# Patient Record
Sex: Male | Born: 1960 | Race: White | Hispanic: No | Marital: Married | Smoking: Former smoker
Health system: Southern US, Community
[De-identification: ages and names within clinical notes are randomized; demographics above are authoritative.]

## PROBLEM LIST (undated history)

## (undated) DIAGNOSIS — G8929 Other chronic pain: Secondary | ICD-10-CM

## (undated) DIAGNOSIS — M549 Dorsalgia, unspecified: Secondary | ICD-10-CM

## (undated) DIAGNOSIS — R05 Cough: Secondary | ICD-10-CM

## (undated) DIAGNOSIS — F419 Anxiety disorder, unspecified: Secondary | ICD-10-CM

## (undated) DIAGNOSIS — I10 Essential (primary) hypertension: Secondary | ICD-10-CM

## (undated) DIAGNOSIS — K759 Inflammatory liver disease, unspecified: Secondary | ICD-10-CM

## (undated) DIAGNOSIS — R059 Cough, unspecified: Secondary | ICD-10-CM

## (undated) HISTORY — PX: BACK SURGERY: SHX140

## (undated) HISTORY — PX: HERNIA REPAIR: SHX51

---

## 2006-08-23 HISTORY — PX: HAND FUSION: SHX975

## 2006-11-02 ENCOUNTER — Observation Stay (HOSPITAL_COMMUNITY): Admission: EM | Admit: 2006-11-02 | Discharge: 2006-11-03 | Payer: Self-pay | Admitting: Emergency Medicine

## 2009-05-23 ENCOUNTER — Ambulatory Visit (HOSPITAL_COMMUNITY): Admission: RE | Admit: 2009-05-23 | Discharge: 2009-05-24 | Payer: Self-pay | Admitting: Orthopaedic Surgery

## 2010-11-27 LAB — BASIC METABOLIC PANEL
BUN: 11 mg/dL (ref 6–23)
Chloride: 105 mEq/L (ref 96–112)
Creatinine, Ser: 0.8 mg/dL (ref 0.4–1.5)
GFR calc Af Amer: >60 mL/min (ref >60)
GFR calc non Af Amer: >60 mL/min (ref >60)

## 2010-11-27 LAB — CBC
MCV: 98.7 fL (ref 78.0–100.0)
Platelets: 167 10*3/uL (ref 150–400)
RBC: 4.41 MIL/uL (ref 4.22–5.81)
WBC: 7.5 10*3/uL (ref 4.0–10.5)

## 2010-11-27 LAB — DIFFERENTIAL
Eosinophils Absolute: 0.3 10*3/uL (ref 0.0–0.7)
Lymphocytes Relative: 23 % (ref 12–46)
Lymphs Abs: 1.8 10*3/uL (ref 0.7–4.0)
Monocytes Relative: 10 % (ref 3–12)
Neutro Abs: 4.5 10*3/uL (ref 1.7–7.7)
Neutrophils Relative %: 60 % (ref 43–77)

## 2011-01-08 NOTE — Op Note (Signed)
NAME:  Philip Freeman, Philip Freeman                ACCOUNT NO.:  [id number]   MEDICAL RECORD NO.:  [id number]          PATIENT TYPE:  OBV   LOCATION:  3011                         FACILITY:  [facility]   PHYSICIAN:  Mark C. Ophelia Charter, M.D.    DATE OF BIRTH:  October 07, 1960   DATE OF PROCEDURE:  11/02/2006  DATE OF DISCHARGE:                               OPERATIVE REPORT   PREOPERATIVE DIAGNOSIS:  Right hand severe crush injury with multiple  open fractures and nerve injuries.   POSTOPERATIVE DIAGNOSIS:  Right hand severe crush injury with multiple  open fractures and nerve injuries.   PROCEDURES:  1. Irrigation and excisional debridement of skin, subcutaneous tissue      and bone, first and second opened metacarpal shaft fractures.  2. Open reduction and internal fixation, first and second metacarpal      shaft fractures.  3. Irrigation and debridement of skin, subcutaneous tissue and bone,      open thumb intra-articular distal phalanx fracture.  4. Open reduction and internal fixation, thumb distal phalanx      fracture.  5. Carpal tunnel release and release of thenar compartment.  6. Microscopic repair at the carpal tunnel level of complete median      nerve laceration.  7. Microscopic repair of the first common digital nerve (to thumb and      index), in [place] man[place].  8. Closure of multiple and lacerations, palmar and dorsal, greater      than 30 cm.  9. Irrigation and debridement, skin and subcutaneous tissue, right      fourth metacarpal shaft fracture.  10.Repair of extensor pollicis longus tendon.   SURGEON:  Annell Greening, MD   ANESTHESIA:  GOT.   TOURNIQUET TIME:  Up x6 minutes during prepping, down x42 minutes, up x1  hour 5 minutes, for a total tourniquet time of 1 hour 11 minutes.   ESTIMATED BLOOD LOSS:  200 mL.   This 50 year old male was at work on the job and got his hand crushed in  a trimming machine.  The machine normally trims off the excess edges  from plastic containers.   There were multiple lacerations in the hand  and complete numbness of the hand, multiple open lacerations dorsal,  volar, with thumb deformity.  X-rays demonstrated a fracture of the  midshaft of the thumb metacarpal which is open, midshaft fracture of the  index metacarpal which was open, open fourth metacarpal nondisplaced  shaft fracture.  Open IP joint of the thumb with disruption of extensor  tendon.  The patient out open extensor tendon laceration at the level of  the IP joint of the thumb.  The tourniquet had to be inflated during  prepping due to blood loss at 250.  Once Ancef been given, time-out had  been taken, the Ancef was redosed.  One dose had been given in the  emergency room and he had received his tetanus.  Usual stockinette,  extremity sheets and drapes were applied.  With the tourniquet up, the  wound was copiously irrigated, the wound was reinspected, and all the  above  injuries were noticed.  The thenar muscle had a crush injury, had  opened, and the thenar fascia was split.  There was a laceration through  the thenar muscle extending down; however, the common digital artery to  the thumb and index was intact.  There was some venous bleeding over the  dorsum over the first dorsal compartment.  X-rays revealed a transverse  laceration of the first communal digital artery.  The operative  microscope was brought into the room and draped.  Open fractures of the  thumb metacarpal and index metacarpal were sharply debrided, removing  some small fragments of bone.  Fragments were reduced and then crossed K-  wires were used for stabilization and checked under fluoroscopy and K-  wires adjusted.  Next the open injury to the IP joint of the thumb was  inspected.  Bone was debrided of some small pieces.  This was split  between the condyles.  This was reduced and using two different K-wires  for reduction, an 0.045 was placed across the two condyles.  Vicryl 4-0  was used for  repair of the extensor tendon.  There was a laceration that  extended from the thenar muscle down to the carpal canal, and this was  extended proximally when visualization revealed a transverse laceration  of the median nerve at the distal wrist crease level.  The incision was  extended 4-5 cm.  Distal forearm fascia was split following the ulnar  border of the median nerve.  The motor branch of the thenar muscle was  identified distally and the carpal canal was in a distal and radial  position.  The palmar fascia was split and the common digital nerves  were followed up.  Branches to the long and ring finger were intact as  well as the common to the index, long finger was intact.  Using the  operative microscope draped, the common digital nerve of the thumb and  index finger was repaired with total of five 8-0 sutures using the  operative microscope and microscopic hand instruments.  Next, the median  nerve was repaired with a total of 10 sutures in the epineurium, making  sure to line up the thenar motor branch portion of the nerve in  appropriate position.  Since this laceration was transverse, the nerve  lay down in good position and appropriate orientation was easy to  obtain.  Flexor tendons underneath the median nerve were intact.  Vascular arch in the palm was intact.  Incision had be extended distally  and there was a laceration that extended following the distal wrist  crease from the small finger all the way across to the base of the  thumb.  Flexor tendons were carefully inspected, lumbricals, common  digital nerves, and all were intact.  Copious irrigation was used.  Excision of devitalized skin flaps in pieces from the crush injury.  Skin was reapproximated with 4-0 nylon, closing approximately 30-35 cm  of lacerations on the dorsum of the hand as well as over the palmar  surface of the hand.  There was an open laceration of the mid to distal aspect over the fourth  metacarpal.  Extensor tendon was inspected and  was intact.  The open fracture was irrigated, debridement of some skin  and subcutaneous tissue but did not require bone debridement to the  fourth metacarpal.  The third metacarpal was intact and only had skin  lacerations involving this area.  Skin was released from the K-wires to  make sure there was no tension.  K-wires were bent, cut, pin protectors  applied.  Skin was then closed, taking approximately an hour and a half  to close all lacerations anatomically, lining up the skin folds.  The  pin to the thumb was bent and cut as well and the laceration over the  dorsum of the thumb extended along the radial aspect of the nail plate  and eponychium junction.  This was repaired with sutures back through  the skin and then penetrating through the nail bed and nail plate of 4-0  nylon.  There was good vascularity to the thumb.  Spot fluoro pictures  were taken documenting the reduction and fixation of the bones.  The  patient was placed in a short-arm splint with tweeners, 4x4s, Webril,  the wrist extended, MCPs flexed, PIPs extended.  The patient tolerated  the procedure well, transferred to the recovery room in stable  condition.      Mark C. Ophelia Charter, M.D.  Electronically Signed     MCY/MEDQ  D:  11/02/2006  T:  11/04/2006  Job:  [id number]   cc:   [employer]

## 2011-08-27 ENCOUNTER — Observation Stay (HOSPITAL_COMMUNITY): Payer: Medicare PPO

## 2011-08-27 ENCOUNTER — Observation Stay (HOSPITAL_COMMUNITY)
Admission: EM | Admit: 2011-08-27 | Discharge: 2011-08-27 | Disposition: A | Discharge status: Home / Self Care | Payer: Medicare PPO | Attending: Emergency Medicine | Admitting: Emergency Medicine

## 2011-08-27 DIAGNOSIS — M5126 Other intervertebral disc displacement, lumbar region: Secondary | ICD-10-CM | POA: Insufficient documentation

## 2011-08-27 DIAGNOSIS — R5383 Other fatigue: Secondary | ICD-10-CM | POA: Insufficient documentation

## 2011-08-27 DIAGNOSIS — G8929 Other chronic pain: Principal | ICD-10-CM | POA: Insufficient documentation

## 2011-08-27 DIAGNOSIS — R209 Unspecified disturbances of skin sensation: Secondary | ICD-10-CM | POA: Insufficient documentation

## 2011-08-27 DIAGNOSIS — F17200 Nicotine dependence, unspecified, uncomplicated: Secondary | ICD-10-CM | POA: Insufficient documentation

## 2011-08-27 DIAGNOSIS — R5381 Other malaise: Secondary | ICD-10-CM | POA: Insufficient documentation

## 2011-08-27 DIAGNOSIS — M549 Dorsalgia, unspecified: Secondary | ICD-10-CM

## 2011-08-27 HISTORY — DX: Other chronic pain: G89.29

## 2011-08-27 HISTORY — DX: Dorsalgia, unspecified: M54.9

## 2011-08-27 MED ORDER — DIAZEPAM 5 MG/ML IJ SOLN: 5.0000 mg | Freq: Four times a day (QID) | INTRAMUSCULAR | Status: DC | PRN

## 2011-08-27 MED ORDER — DIAZEPAM 5 MG PO TABS: 5.0000 mg | ORAL_TABLET | Freq: Four times a day (QID) | ORAL | Status: DC | PRN

## 2011-08-27 MED ORDER — PROMETHAZINE HCL 25 MG PO TABS: 25.0000 mg | ORAL_TABLET | Freq: Four times a day (QID) | ORAL | Status: AC | PRN

## 2011-08-27 MED ORDER — HYDROMORPHONE HCL PF 1 MG/ML IJ SOLN
1.0000 mg | Freq: Once | INTRAMUSCULAR | Status: AC
  Administered 2011-08-27: 1 mg via INTRAVENOUS
  Filled 2011-08-27: qty 1

## 2011-08-27 MED ORDER — OXYCODONE-ACETAMINOPHEN 5-325 MG PO TABS: 1.0000 | ORAL_TABLET | Freq: Four times a day (QID) | ORAL | Status: DC | PRN

## 2011-08-27 MED ORDER — HYDROMORPHONE HCL PF 1 MG/ML IJ SOLN: 1.0000 mg | Freq: Four times a day (QID) | INTRAMUSCULAR | Status: DC | PRN

## 2011-08-27 MED ORDER — METHYLPREDNISOLONE SODIUM SUCC 125 MG IJ SOLR
125.0000 mg | Freq: Two times a day (BID) | INTRAMUSCULAR | Status: DC
  Administered 2011-08-27: 125 mg via INTRAVENOUS
  Filled 2011-08-27: qty 2

## 2011-08-27 MED ORDER — KETOROLAC TROMETHAMINE 30 MG/ML IJ SOLN: 30.0000 mg | Freq: Four times a day (QID) | INTRAMUSCULAR | Status: DC | PRN

## 2011-08-27 MED ORDER — KETOROLAC TROMETHAMINE 30 MG/ML IJ SOLN
30.0000 mg | Freq: Once | INTRAMUSCULAR | Status: AC
  Administered 2011-08-27: 30 mg via INTRAVENOUS
  Filled 2011-08-27: qty 1

## 2011-08-27 MED ORDER — CYCLOBENZAPRINE HCL 10 MG PO TABS: 5.0000 mg | ORAL_TABLET | Freq: Three times a day (TID) | ORAL | Status: DC | PRN

## 2011-08-27 MED ORDER — OXYCODONE-ACETAMINOPHEN 10-325 MG PO TABS: 1.0000 | ORAL_TABLET | ORAL | Status: DC | PRN

## 2011-08-27 MED ORDER — SODIUM CHLORIDE 0.9 % IV BOLUS (SEPSIS)
1000.0000 mL | Freq: Once | INTRAVENOUS | Status: AC
  Administered 2011-08-27: 1000 mL via INTRAVENOUS

## 2011-08-27 MED ORDER — MORPHINE SULFATE 4 MG/ML IJ SOLN: 4.0000 mg | INTRAMUSCULAR | Status: DC | PRN

## 2011-08-27 MED ORDER — ONDANSETRON HCL 4 MG/2ML IJ SOLN
4.0000 mg | Freq: Four times a day (QID) | INTRAMUSCULAR | Status: DC | PRN
  Administered 2011-08-27: 4 mg via INTRAVENOUS
  Filled 2011-08-27: qty 2

## 2011-08-27 MED ORDER — ACETAMINOPHEN 325 MG PO TABS: 650.0000 mg | ORAL_TABLET | ORAL | Status: DC | PRN

## 2011-08-27 NOTE — ED Notes (Signed)
Pt given pillow and warm blanket.

## 2011-08-27 NOTE — ED Notes (Signed)
Received from mri. Awake alert and oriented x 3. Vs stable.

## 2011-08-27 NOTE — ED Notes (Signed)
Pt presents with 6 month h/o low back pain.  Last MRI was 11/28, showed protruding disc.  Epidural done 12/18, pain has worsened x 4-5 days.  Pain radiates down L leg, pt denies any urinary or fecal incontinence.

## 2011-08-27 NOTE — ED Notes (Signed)
Patient transported to MRI stable and in no acute distress.

## 2011-08-27 NOTE — ED Notes (Signed)
Pt states he has had back pain x 6 months and had appointment today at 3 pm to see dr Ophelia Charter but pain has been so bad he  Has not been able to get out odf the bed x 2 days

## 2011-08-27 NOTE — ED Provider Notes (Signed)
5:44 PM spoke with dr. Ophelia Charter who read the MRI and stated that there were no findings on MRI that were alarming. No loss in bowel or bladder function. Advised giving him pain medication to go home and following up with him in the office.  Patient does not present with bilateral leg pain and weakness, urinary retention with overflow incontinence, fecal incontinece, saddle anesthesia. No acute onset of back, flank or groint pain. No history of cancer, unexplained weight loss. No fevers. No injuries. No concern for caudal equina syndrome per Dr. Ophelia Charter.  Advised patient of warning signs to return.     Demetrius Charity, PA 08/27/11 1746

## 2011-08-27 NOTE — ED Provider Notes (Signed)
Medical screening examination/treatment/procedure(s) were performed by non-physician practitioner and as supervising physician I was immediately available for consultation/collaboration.  Zunaira Lamy P Lindel Marcell, MD 08/27/11 1952 

## 2011-08-27 NOTE — ED Provider Notes (Signed)
History     CSN: [id number]  Arrival date & time 08/27/11  1149   First MD Initiated Contact with Patient 08/27/11 1429     3:36 PM HPI Patient reports chronic back pain for several months. Reports pain begins in her lower left back and radiates down the entire left lower extremity. Reports he has been treated by Dr. Ophelia Charter. States Dr. Ophelia Charter wanted to do surgery but his insurance had not approved it yet. Reports pain has worsened. States unbearable now and able to ambulate. Reports having numbness on medial distal thigh and down entire left lateral thigh. Denies urinary or fecal incontinence. Reports weakness. Patient states he has "spinal compression". Wife is very demanding. Is demanding he have surgery today. Discussed with patient and family that I would speak with Dr. Ophelia Charter personally.  Patient is a 51 y.o. male presenting with back pain. The history is provided by the patient and the spouse.  Back Pain  This is a chronic problem. Episode onset: several months. The problem occurs constantly. The problem has been gradually worsening. The pain is associated with no known injury. The pain is present in the lumbar spine. The quality of the pain is described as stabbing and shooting. The pain radiates to the left thigh, left knee and left foot. The pain is at a severity of 10/10. The pain is severe. The symptoms are aggravated by bending, twisting and certain positions. Associated symptoms include numbness, leg pain, paresthesias, tingling and weakness. Pertinent negatives include no chest pain, no fever, no headaches, no abdominal pain, no bowel incontinence, no perianal numbness, no bladder incontinence, no dysuria, no pelvic pain and no paresis.    Past Medical History  Diagnosis Date  . Chronic back pain     Past Surgical History  Procedure Date  . Hand fusion     No family history on file.  History  Substance Use Topics  . Smoking status: Current Everyday Smoker -- 1.0 packs/day  .  Smokeless tobacco: Not on file  . Alcohol Use: Yes      Review of Systems  Constitutional: Negative for fever and chills.  HENT: Negative for neck pain.   Respiratory: Negative for cough and shortness of breath.   Cardiovascular: Negative for chest pain and palpitations.  Gastrointestinal: Negative for nausea, vomiting, abdominal pain and bowel incontinence.  Genitourinary: Negative for bladder incontinence, dysuria, hematuria, flank pain and pelvic pain.  Musculoskeletal: Positive for back pain. Negative for myalgias and gait problem.       Denies saddle anesthesias, perineal numbness, bowel incontinence, urinary incontinence  Neurological: Positive for tingling, weakness, numbness and paresthesias. Negative for dizziness and headaches.  All other systems reviewed and are negative.    Allergies  Review of patient's allergies indicates no known allergies.  Home Medications   Current Outpatient Rx  Name Route Sig Dispense Refill  . AMLODIPINE BESYLATE 5 MG PO TABS Oral Take 5 mg by mouth daily.      Marland Kitchen CLONAZEPAM 0.5 MG PO TABS Oral Take 0.5 mg by mouth at bedtime as needed.      Marland Kitchen HYDROCODONE-ACETAMINOPHEN 5-325 MG PO TABS Oral Take 1 tablet by mouth every 6 (six) hours as needed. For pain     . MENTHOL (TOPICAL ANALGESIC) 5 % EX PADS Apply externally Apply 1 patch topically daily as needed. For back pain     . METOPROLOL TARTRATE 25 MG PO TABS Oral Take 25 mg by mouth 2 (two) times daily.  BP 136/86  Pulse 84  Temp(Src) 97.4 F (36.3 C) (Oral)  Resp 16  Ht 5\' 10"  (1.778 m)  Wt 160 lb (72.576 kg)  BMI 22.96 kg/m2  SpO2 100%  Physical Exam  Constitutional: He is oriented to person, place, and time. He appears well-developed and well-nourished.  HENT:  Head: Normocephalic and atraumatic.  Eyes: Conjunctivae are normal. Pupils are equal, round, and reactive to light.  Neck: Normal range of motion. Neck supple.  Cardiovascular: Normal rate, regular rhythm and  normal heart sounds.   Pulmonary/Chest: Effort normal and breath sounds normal.  Abdominal: Soft. Bowel sounds are normal.  Musculoskeletal:       Left knee: Normal. He exhibits normal range of motion, no swelling and no effusion. no tenderness found.       Left ankle: Normal. He exhibits normal range of motion, no swelling and normal pulse. no tenderness.       Lumbar back: He exhibits decreased range of motion, tenderness and bony tenderness. He exhibits no swelling, no edema, no pain, no spasm and normal pulse.       Left lower leg: He exhibits no tenderness, no bony tenderness, no swelling, no edema, no deformity and no laceration.  Neurological: He is alert and oriented to person, place, and time.  Skin: Skin is warm and dry. No rash noted. No erythema. No pallor.  Psychiatric: He has a normal mood and affect. His behavior is normal.    ED Course  Procedures    MDM  3:47 PM Spoke with Dr. Ophelia Charter. Reports MRI done late last year showed a disc herniation without spinal compression. Recommended another MRI be done today since patient complains of worsening symptoms. States that patient can have Toradol for his pain and likely discharge if no worsening symptoms on MRI. States if change on MRI may discuss with Dr. Magnus Ivan who is on call for orthopedic surgery. Discussed this with patient and family. Advised in addition we will place patient on the back pain protocol until pain is better controlled.        Thomasene Lot, [place] 08/27/11 1549

## 2011-08-30 ENCOUNTER — Other Ambulatory Visit (HOSPITAL_COMMUNITY): Payer: Self-pay | Admitting: Orthopaedic Surgery

## 2011-08-30 ENCOUNTER — Encounter (HOSPITAL_COMMUNITY): Payer: Self-pay

## 2011-08-30 DIAGNOSIS — M5126 Other intervertebral disc displacement, lumbar region: Secondary | ICD-10-CM | POA: Diagnosis present

## 2011-08-30 NOTE — H&P (Signed)
Philip Freeman is an 51 y.o. male.   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  He has been treated successfully for the last year with ESI's.  Last ESI was in November 2012 and lasted only a short time.  Now his pain has returned and is unrelieved with activity modification and analgesics. MRI of the lumbar spine done in October 2012 showed left paracentral HNP at L4-5 with mass effect on the thecal sac, lateral recess and L5 nerve root.  After discussion of risks and benefits as well as surgical vs nonsurgical treatment, pt wishes to proceed with microdiscectomy at left L4-5 by Dr. Ophelia Charter.  Past Medical History  Diagnosis Date  . Chronic back pain     Past Surgical History  Procedure Date  . Hand fusion     No family history on file. Social History:  reports that he has been smoking.  He does not have any smokeless tobacco history on file. He reports that he drinks alcohol. He reports that he does not use illicit drugs.  Allergies: No Known Allergies  No current facility-administered medications on file as of .   Medications Prior to Admission  Medication Sig Dispense Refill  . amLODipine (NORVASC) 5 MG tablet Take 5 mg by mouth daily.        . clonazePAM (KLONOPIN) 0.5 MG tablet Take 0.5-1 mg by mouth 2 (two) times daily. 1 tab in the morning and 2 tablets at night      . Menthol, Topical Analgesic, (ICY HOT) 5 % PADS Apply 1 patch topically daily as needed. For back pain       . oxyCODONE-acetaminophen (PERCOCET) 10-325 MG per tablet Take 1 tablet by mouth every 4 (four) hours as needed for pain.  20 tablet  0  . promethazine (PHENERGAN) 25 MG tablet Take 1 tablet (25 mg total) by mouth every 6 (six) hours as needed for nausea.  12 tablet  0    No results found for this or any previous visit (from the past 48 hour(s)). No results found.  Review of Systems    Constitutional: Negative.   HENT: Negative.   Eyes: Negative.   Respiratory: Negative.   Cardiovascular: Negative.   Gastrointestinal: Negative.   Genitourinary: Negative.   Musculoskeletal: Positive for back pain.  Skin: Negative.   Neurological:       Left leg pain and occasional numbness  Endo/Heme/Allergies: Negative.   Psychiatric/Behavioral: Negative.     There were no vitals taken for this visit. Physical Exam  Constitutional: He is oriented to person, place, and time. He appears well-developed and well-nourished.  HENT:  Head: Normocephalic and atraumatic.  Eyes: EOM are normal. Pupils are equal, round, and reactive to light.  Neck: Normal range of motion.  Cardiovascular: Normal rate.   Respiratory: Effort normal.  GI: Soft.  Musculoskeletal:       Pt has weakness left EHL and ant. tib  At 4/5.  Positive SLR left.  Distal pulse intact   Neurological: He is alert and oriented to person, place, and time.  Skin: Skin is warm and dry.     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  Toua Stites M 08/30/2011, 3:27 PM

## 2011-08-31 ENCOUNTER — Encounter (HOSPITAL_COMMUNITY): Payer: Self-pay | Admitting: *Deleted

## 2011-08-31 MED ORDER — CEFAZOLIN SODIUM 1-5 GM-% IV SOLN
1.0000 g | INTRAVENOUS | Status: AC
  Administered 2011-09-01: 2 g via INTRAVENOUS
  Filled 2011-08-31: qty 50

## 2011-08-31 MED ORDER — CHLORHEXIDINE GLUCONATE 4 % EX LIQD: 60.0000 mL | Freq: Once | CUTANEOUS | Status: DC

## 2011-08-31 NOTE — ED Provider Notes (Signed)
Medical screening examination/treatment/procedure(s) were performed by non-physician practitioner and as supervising physician I was immediately available for consultation/collaboration.   Deforest Maiden, MD 08/31/11 1255 

## 2011-09-01 ENCOUNTER — Encounter (HOSPITAL_COMMUNITY): Payer: Self-pay | Admitting: Anesthesiology

## 2011-09-01 ENCOUNTER — Ambulatory Visit (HOSPITAL_COMMUNITY): Payer: Medicare PPO

## 2011-09-01 ENCOUNTER — Encounter (HOSPITAL_COMMUNITY)
Admission: RE | Disposition: A | Discharge status: Home / Self Care | Payer: Self-pay | Source: Ambulatory Visit | Attending: Orthopaedic Surgery

## 2011-09-01 ENCOUNTER — Ambulatory Visit (HOSPITAL_COMMUNITY)
Admission: RE | Admit: 2011-09-01 | Discharge: 2011-09-02 | Disposition: A | Discharge status: Home / Self Care | Payer: Medicare PPO | Source: Ambulatory Visit | Attending: Orthopaedic Surgery | Admitting: Orthopaedic Surgery

## 2011-09-01 ENCOUNTER — Other Ambulatory Visit: Payer: Self-pay

## 2011-09-01 ENCOUNTER — Ambulatory Visit (HOSPITAL_COMMUNITY): Payer: Medicare PPO | Admitting: Anesthesiology

## 2011-09-01 DIAGNOSIS — M5126 Other intervertebral disc displacement, lumbar region: Secondary | ICD-10-CM | POA: Diagnosis present

## 2011-09-01 DIAGNOSIS — F1010 Alcohol abuse, uncomplicated: Secondary | ICD-10-CM | POA: Insufficient documentation

## 2011-09-01 DIAGNOSIS — I10 Essential (primary) hypertension: Secondary | ICD-10-CM | POA: Insufficient documentation

## 2011-09-01 DIAGNOSIS — F17200 Nicotine dependence, unspecified, uncomplicated: Secondary | ICD-10-CM | POA: Insufficient documentation

## 2011-09-01 DIAGNOSIS — F411 Generalized anxiety disorder: Secondary | ICD-10-CM | POA: Insufficient documentation

## 2011-09-01 DIAGNOSIS — B1920 Unspecified viral hepatitis C without hepatic coma: Secondary | ICD-10-CM | POA: Insufficient documentation

## 2011-09-01 HISTORY — DX: Cough, unspecified: R05.9

## 2011-09-01 HISTORY — DX: Essential (primary) hypertension: I10

## 2011-09-01 HISTORY — DX: Cough: R05

## 2011-09-01 HISTORY — PX: LUMBAR LAMINECTOMY: SHX95

## 2011-09-01 HISTORY — DX: Inflammatory liver disease, unspecified: K75.9

## 2011-09-01 HISTORY — DX: Anxiety disorder, unspecified: F41.9

## 2011-09-01 LAB — CBC
HCT: 39.1 % (ref 39.0–52.0)
Hemoglobin: 14.2 g/dL (ref 13.0–17.0)
MCH: 35.3 pg — ABNORMAL HIGH (ref 26.0–34.0)
MCHC: 36.3 g/dL — ABNORMAL HIGH (ref 30.0–36.0)
MCV: 97.3 fL (ref 78.0–100.0)
Platelets: 140 10*3/uL — ABNORMAL LOW (ref 150–400)
RBC: 4.02 MIL/uL — ABNORMAL LOW (ref 4.22–5.81)
RDW: 11.7 % (ref 11.5–15.5)
WBC: 16.3 10*3/uL — ABNORMAL HIGH (ref 4.0–10.5)

## 2011-09-01 LAB — SURGICAL PCR SCREEN
MRSA, PCR: NEGATIVE
Staphylococcus aureus: NEGATIVE

## 2011-09-01 LAB — COMPREHENSIVE METABOLIC PANEL
AST: 42 U/L — ABNORMAL HIGH (ref 0–37)
Albumin: 3.7 g/dL (ref 3.5–5.2)
Alkaline Phosphatase: 55 U/L (ref 39–117)
CO2: 25 mEq/L (ref 19–32)
Chloride: 101 mEq/L (ref 96–112)
GFR calc non Af Amer: >90 mL/min (ref >90)
Potassium: 3.6 mEq/L (ref 3.5–5.1)
Total Bilirubin: 0.6 mg/dL (ref 0.3–1.2)

## 2011-09-01 LAB — PROTIME-INR: Prothrombin Time: 13.2 seconds (ref 11.6–15.2)

## 2011-09-01 LAB — URINALYSIS, ROUTINE W REFLEX MICROSCOPIC
Bilirubin Urine: NEGATIVE
Glucose, UA: NEGATIVE mg/dL
Hgb urine dipstick: NEGATIVE
Protein, ur: NEGATIVE mg/dL
Urobilinogen, UA: 0.2 mg/dL (ref 0.0–1.0)

## 2011-09-01 SURGERY — MICRODISCECTOMY LUMBAR LAMINECTOMY
Anesthesia: General | Site: Spine Lumbar | Laterality: Left | Wound class: Clean

## 2011-09-01 MED ORDER — CEFAZOLIN SODIUM 1-5 GM-% IV SOLN
1.0000 g | Freq: Three times a day (TID) | INTRAVENOUS | Status: AC
  Administered 2011-09-01 – 2011-09-02 (×2): 1 g via INTRAVENOUS
  Filled 2011-09-01 (×2): qty 50

## 2011-09-01 MED ORDER — GLYCOPYRROLATE 0.2 MG/ML IJ SOLN
INTRAMUSCULAR | Status: DC | PRN
  Administered 2011-09-01: .6 mg via INTRAVENOUS

## 2011-09-01 MED ORDER — MEPERIDINE HCL 25 MG/ML IJ SOLN: 6.2500 mg | INTRAMUSCULAR | Status: DC | PRN

## 2011-09-01 MED ORDER — ONDANSETRON HCL 4 MG/2ML IJ SOLN
INTRAMUSCULAR | Status: DC | PRN
  Administered 2011-09-01: 4 mg via INTRAVENOUS

## 2011-09-01 MED ORDER — METOPROLOL SUCCINATE ER 25 MG PO TB24
25.0000 mg | ORAL_TABLET | Freq: Every day | ORAL | Status: DC
  Filled 2011-09-01: qty 1

## 2011-09-01 MED ORDER — KCL IN DEXTROSE-NACL 20-5-0.45 MEQ/L-%-% IV SOLN
INTRAVENOUS | Status: DC
  Administered 2011-09-01 – 2011-09-02 (×2): via INTRAVENOUS
  Filled 2011-09-01 (×3): qty 1000

## 2011-09-01 MED ORDER — HYDROCODONE-ACETAMINOPHEN 5-325 MG PO TABS: 1.0000 | ORAL_TABLET | ORAL | Status: DC | PRN

## 2011-09-01 MED ORDER — SODIUM CHLORIDE 0.9 % IJ SOLN: 3.0000 mL | INTRAMUSCULAR | Status: DC | PRN

## 2011-09-01 MED ORDER — PROMETHAZINE HCL 25 MG/ML IJ SOLN: 6.2500 mg | INTRAMUSCULAR | Status: DC | PRN

## 2011-09-01 MED ORDER — ACETAMINOPHEN 650 MG RE SUPP: 650.0000 mg | RECTAL | Status: DC | PRN

## 2011-09-01 MED ORDER — ALBUTEROL SULFATE (5 MG/ML) 0.5% IN NEBU
2.5000 mg | INHALATION_SOLUTION | RESPIRATORY_TRACT | Status: DC
  Administered 2011-09-01 – 2011-09-02 (×5): 2.5 mg via RESPIRATORY_TRACT
  Filled 2011-09-01 (×5): qty 0.5

## 2011-09-01 MED ORDER — AMLODIPINE BESYLATE 5 MG PO TABS
5.0000 mg | ORAL_TABLET | Freq: Every day | ORAL | Status: DC
  Filled 2011-09-01: qty 1

## 2011-09-01 MED ORDER — LIDOCAINE HCL (CARDIAC) 20 MG/ML IV SOLN
INTRAVENOUS | Status: DC | PRN
  Administered 2011-09-01: 40 mg via INTRAVENOUS

## 2011-09-01 MED ORDER — CLONAZEPAM 0.5 MG PO TABS
0.5000 mg | ORAL_TABLET | Freq: Two times a day (BID) | ORAL | Status: DC
  Administered 2011-09-01 – 2011-09-02 (×2): 0.5 mg via ORAL
  Filled 2011-09-01 (×2): qty 1

## 2011-09-01 MED ORDER — KETOROLAC TROMETHAMINE 30 MG/ML IJ SOLN
30.0000 mg | Freq: Four times a day (QID) | INTRAMUSCULAR | Status: DC
  Administered 2011-09-01 – 2011-09-02 (×3): 30 mg via INTRAVENOUS
  Filled 2011-09-01 (×7): qty 1

## 2011-09-01 MED ORDER — MENTHOL 3 MG MT LOZG: 1.0000 | LOZENGE | OROMUCOSAL | Status: DC | PRN

## 2011-09-01 MED ORDER — ACETAMINOPHEN 325 MG PO TABS: 650.0000 mg | ORAL_TABLET | ORAL | Status: DC | PRN

## 2011-09-01 MED ORDER — HYDROMORPHONE HCL PF 1 MG/ML IJ SOLN
INTRAMUSCULAR | Status: DC | PRN
Start: 2011-09-01 — End: 2011-09-01
  Administered 2011-09-01 (×2): 0.5 mg via INTRAVENOUS

## 2011-09-01 MED ORDER — BISACODYL 10 MG RE SUPP: 10.0000 mg | Freq: Every day | RECTAL | Status: DC | PRN

## 2011-09-01 MED ORDER — ZOLPIDEM TARTRATE 10 MG PO TABS: 10.0000 mg | ORAL_TABLET | Freq: Every evening | ORAL | Status: DC | PRN

## 2011-09-01 MED ORDER — LACTATED RINGERS IV SOLN
INTRAVENOUS | Status: DC
  Administered 2011-09-01: 13:00:00 via INTRAVENOUS

## 2011-09-01 MED ORDER — 0.9 % SODIUM CHLORIDE (POUR BTL) OPTIME
TOPICAL | Status: DC | PRN
  Administered 2011-09-01: 1000 mL

## 2011-09-01 MED ORDER — HYDROMORPHONE HCL PF 1 MG/ML IJ SOLN: 0.2500 mg | INTRAMUSCULAR | Status: DC | PRN

## 2011-09-01 MED ORDER — PHENOL 1.4 % MT LIQD
1.0000 | OROMUCOSAL | Status: DC | PRN
  Filled 2011-09-01: qty 177

## 2011-09-01 MED ORDER — METHOCARBAMOL 500 MG PO TABS
500.0000 mg | ORAL_TABLET | Freq: Four times a day (QID) | ORAL | Status: DC | PRN
  Administered 2011-09-01 – 2011-09-02 (×2): 500 mg via ORAL
  Filled 2011-09-01 (×2): qty 1

## 2011-09-01 MED ORDER — DOCUSATE SODIUM 100 MG PO CAPS
100.0000 mg | ORAL_CAPSULE | Freq: Two times a day (BID) | ORAL | Status: DC
  Administered 2011-09-01 – 2011-09-02 (×2): 100 mg via ORAL
  Filled 2011-09-01 (×4): qty 1

## 2011-09-01 MED ORDER — MIDAZOLAM HCL 5 MG/5ML IJ SOLN
INTRAMUSCULAR | Status: DC | PRN
  Administered 2011-09-01: 2 mg via INTRAVENOUS

## 2011-09-01 MED ORDER — PROPOFOL 10 MG/ML IV EMUL
INTRAVENOUS | Status: DC | PRN
  Administered 2011-09-01: 200 mg via INTRAVENOUS

## 2011-09-01 MED ORDER — PANTOPRAZOLE SODIUM 40 MG IV SOLR
40.0000 mg | Freq: Every day | INTRAVENOUS | Status: DC
  Administered 2011-09-01: 40 mg via INTRAVENOUS
  Filled 2011-09-01 (×2): qty 40

## 2011-09-01 MED ORDER — HYDROMORPHONE HCL PF 1 MG/ML IJ SOLN
0.5000 mg | INTRAMUSCULAR | Status: DC | PRN
  Administered 2011-09-01: 1 mg via INTRAVENOUS
  Filled 2011-09-01: qty 1

## 2011-09-01 MED ORDER — LACTATED RINGERS IV SOLN
INTRAVENOUS | Status: DC | PRN
  Administered 2011-09-01 (×2): via INTRAVENOUS

## 2011-09-01 MED ORDER — ONDANSETRON HCL 4 MG/2ML IJ SOLN: 4.0000 mg | INTRAMUSCULAR | Status: DC | PRN

## 2011-09-01 MED ORDER — SENNOSIDES-DOCUSATE SODIUM 8.6-50 MG PO TABS
1.0000 | ORAL_TABLET | Freq: Every evening | ORAL | Status: DC | PRN
  Administered 2011-09-01: 1 via ORAL
  Filled 2011-09-01: qty 1

## 2011-09-01 MED ORDER — ROCURONIUM BROMIDE 100 MG/10ML IV SOLN
INTRAVENOUS | Status: DC | PRN
  Administered 2011-09-01: 50 mg via INTRAVENOUS

## 2011-09-01 MED ORDER — ARTIFICIAL TEARS OP OINT
TOPICAL_OINTMENT | OPHTHALMIC | Status: DC | PRN
  Administered 2011-09-01: 1 via OPHTHALMIC

## 2011-09-01 MED ORDER — DEXAMETHASONE SODIUM PHOSPHATE 4 MG/ML IJ SOLN
INTRAMUSCULAR | Status: DC | PRN
  Administered 2011-09-01: 4 mg via INTRAVENOUS

## 2011-09-01 MED ORDER — NEOSTIGMINE METHYLSULFATE 1 MG/ML IJ SOLN
INTRAMUSCULAR | Status: DC | PRN
  Administered 2011-09-01: 5 mg via INTRAVENOUS

## 2011-09-01 MED ORDER — METHOCARBAMOL 100 MG/ML IJ SOLN
500.0000 mg | Freq: Four times a day (QID) | INTRAVENOUS | Status: DC | PRN
  Filled 2011-09-01: qty 5

## 2011-09-01 MED ORDER — MUPIROCIN 2 % EX OINT
TOPICAL_OINTMENT | CUTANEOUS | Status: AC
  Administered 2011-09-01: 1 via NASAL
  Filled 2011-09-01: qty 22

## 2011-09-01 MED ORDER — OXYCODONE-ACETAMINOPHEN 5-325 MG PO TABS
1.0000 | ORAL_TABLET | ORAL | Status: DC | PRN
  Administered 2011-09-01 – 2011-09-02 (×4): 2 via ORAL
  Filled 2011-09-01 (×4): qty 2

## 2011-09-01 MED ORDER — FENTANYL CITRATE 0.05 MG/ML IJ SOLN
INTRAMUSCULAR | Status: DC | PRN
  Administered 2011-09-01: 100 ug via INTRAVENOUS
  Administered 2011-09-01: 50 ug via INTRAVENOUS
  Administered 2011-09-01: 100 ug via INTRAVENOUS
  Administered 2011-09-01: 50 ug via INTRAVENOUS

## 2011-09-01 MED ORDER — ALUM & MAG HYDROXIDE-SIMETH 200-200-20 MG/5ML PO SUSP: 30.0000 mL | Freq: Four times a day (QID) | ORAL | Status: DC | PRN

## 2011-09-01 MED ORDER — BUPIVACAINE HCL (PF) 0.25 % IJ SOLN
INTRAMUSCULAR | Status: DC | PRN
  Administered 2011-09-01: 10 mL

## 2011-09-01 SURGICAL SUPPLY — 54 items
APL SKNCLS STERI-STRIP NONHPOA (GAUZE/BANDAGES/DRESSINGS)
BENZOIN TINCTURE PRP APPL 2/3 (GAUZE/BANDAGES/DRESSINGS) ×1 IMPLANT
BUR ROUND FLUTED 4 SOFT TCH (BURR) ×1 IMPLANT
CLOSURE STERI STRIP 1/2 X4 (GAUZE/BANDAGES/DRESSINGS) ×1 IMPLANT
CLOTH BEACON ORANGE TIMEOUT ST (SAFETY) ×2 IMPLANT
CORDS BIPOLAR (ELECTRODE) ×1 IMPLANT
COVER SURGICAL LIGHT HANDLE (MISCELLANEOUS) ×2 IMPLANT
DRAPE MICROSCOPE LEICA (MISCELLANEOUS) ×2 IMPLANT
DRAPE PROXIMA HALF (DRAPES) ×4 IMPLANT
DRSG EMULSION OIL 3X3 NADH (GAUZE/BANDAGES/DRESSINGS) ×1 IMPLANT
DRSG PAD ABDOMINAL 8X10 ST (GAUZE/BANDAGES/DRESSINGS) ×1 IMPLANT
DURAPREP 26ML APPLICATOR (WOUND CARE) ×2 IMPLANT
ELECT REM PT RETURN 9FT ADLT (ELECTROSURGICAL) ×2
ELECTRODE REM PT RTRN 9FT ADLT (ELECTROSURGICAL) ×1 IMPLANT
GLOVE BIOGEL PI IND STRL 7.0 (GLOVE) IMPLANT
GLOVE BIOGEL PI IND STRL 7.5 (GLOVE) ×1 IMPLANT
GLOVE BIOGEL PI IND STRL 8 (GLOVE) ×1 IMPLANT
GLOVE BIOGEL PI INDICATOR 7.0 (GLOVE) ×1
GLOVE BIOGEL PI INDICATOR 7.5 (GLOVE) ×1
GLOVE BIOGEL PI INDICATOR 8 (GLOVE) ×1
GLOVE ECLIPSE 7.0 STRL STRAW (GLOVE) ×2 IMPLANT
GLOVE ORTHO TXT STRL SZ7.5 (GLOVE) ×2 IMPLANT
GLOVE SURG SS PI 6.5 STRL IVOR (GLOVE) ×2 IMPLANT
GOWN PREVENTION PLUS LG XLONG (DISPOSABLE) ×1 IMPLANT
GOWN PREVENTION PLUS XLARGE (GOWN DISPOSABLE) ×1 IMPLANT
GOWN STRL NON-REIN LRG LVL3 (GOWN DISPOSABLE) ×4 IMPLANT
KIT BASIN OR (CUSTOM PROCEDURE TRAY) ×2 IMPLANT
KIT ROOM TURNOVER OR (KITS) ×2 IMPLANT
MANIFOLD NEPTUNE II (INSTRUMENTS) ×2 IMPLANT
NDL HYPO 25GX1X1/2 BEV (NEEDLE) ×1 IMPLANT
NDL SPNL 18GX3.5 QUINCKE PK (NEEDLE) ×1 IMPLANT
NDL SUT .5 MAYO 1.404X.05X (NEEDLE) ×1 IMPLANT
NEEDLE HYPO 25GX1X1/2 BEV (NEEDLE) ×2 IMPLANT
NEEDLE MAYO TAPER (NEEDLE) ×2
NEEDLE SPNL 18GX3.5 QUINCKE PK (NEEDLE) ×2 IMPLANT
NS IRRIG 1000ML POUR BTL (IV SOLUTION) ×2 IMPLANT
PACK LAMINECTOMY ORTHO (CUSTOM PROCEDURE TRAY) ×2 IMPLANT
PAD ARMBOARD 7.5X6 YLW CONV (MISCELLANEOUS) ×4 IMPLANT
PATTIES SURGICAL .5 X.5 (GAUZE/BANDAGES/DRESSINGS) ×2 IMPLANT
PATTIES SURGICAL .75X.75 (GAUZE/BANDAGES/DRESSINGS) IMPLANT
SPONGE GAUZE 4X4 12PLY (GAUZE/BANDAGES/DRESSINGS) ×1 IMPLANT
STAPLER VISISTAT 35W (STAPLE) IMPLANT
STRIP CLOSURE SKIN 1/2X4 (GAUZE/BANDAGES/DRESSINGS) IMPLANT
SUT VIC AB 2-0 CT1 27 (SUTURE) ×2
SUT VIC AB 2-0 CT1 TAPERPNT 27 (SUTURE) ×1 IMPLANT
SUT VICRYL 0 TIES 12 18 (SUTURE) ×1 IMPLANT
SUT VICRYL 4-0 PS2 18IN ABS (SUTURE) ×1 IMPLANT
SUT VICRYL AB 2 0 TIES (SUTURE) ×1 IMPLANT
SYR 20ML ECCENTRIC (SYRINGE) IMPLANT
SYR CONTROL 10ML LL (SYRINGE) ×2 IMPLANT
TAPE CLOTH SURG 4X10 WHT LF (GAUZE/BANDAGES/DRESSINGS) ×1 IMPLANT
TOWEL OR 17X24 6PK STRL BLUE (TOWEL DISPOSABLE) ×2 IMPLANT
TOWEL OR 17X26 10 PK STRL BLUE (TOWEL DISPOSABLE) ×2 IMPLANT
WATER STERILE IRR 1000ML POUR (IV SOLUTION) ×1 IMPLANT

## 2011-09-01 NOTE — Anesthesia Postprocedure Evaluation (Signed)
  Anesthesia Post-op Note  Patient: Philip Freeman  Procedure(s) Performed:  MICRODISCECTOMY LUMBAR LAMINECTOMY - Left Lumbar 4-5 Laminotomy Microdiscectomy  Patient Location: PACU  Anesthesia Type: General  Level of Consciousness: sedated  Airway and Oxygen Therapy: Patient Spontanous Breathing and Patient connected to face mask  Post-op Pain: none  Post-op Assessment: Post-op Vital signs reviewed, Patient's Cardiovascular Status Stable, Respiratory Function Stable and Patent Airway  Post-op Vital Signs: Reviewed and stable  Complications: No apparent anesthesia complications

## 2011-09-01 NOTE — Anesthesia Preprocedure Evaluation (Addendum)
Anesthesia Evaluation  Patient identified by MRN, date of birth, ID band Patient awake    Reviewed: Allergy & Precautions, H&P , NPO status , Patient's Chart, lab work & pertinent test results  Airway Mallampati: II TM Distance: >3 FB Neck ROM: Full    Dental  (+) Poor Dentition and Dental Advisory Given,    Pulmonary Current Smoker,  clear to auscultation        Cardiovascular Exercise Tolerance: Good hypertension, Pt. on medications and Pt. on home beta blockers Regular Normal    Neuro/Psych PSYCHIATRIC DISORDERS Anxiety HNP. Pain and numbness L>R LE. Too painful to walk.  Neuromuscular disease    GI/Hepatic (+)     substance abuse  alcohol use, Hepatitis -, C  Endo/Other  Negative Endocrine ROS  Renal/GU negative Renal ROS  Genitourinary negative   Musculoskeletal negative musculoskeletal ROS (+)   Abdominal   Peds  Hematology negative hematology ROS (+)   Anesthesia Other Findings   Reproductive/Obstetrics                           Anesthesia Physical Anesthesia Plan  ASA: III  Anesthesia Plan: General   Post-op Pain Management:    Induction: Intravenous  Airway Management Planned: Oral ETT  Additional Equipment:   Intra-op Plan:   Post-operative Plan: Extubation in OR  Informed Consent: I have reviewed the patients History and Physical, chart, labs and discussed the procedure including the risks, benefits and alternatives for the proposed anesthesia with the patient or authorized representative who has indicated his/her understanding and acceptance.   Dental advisory given  Plan Discussed with: CRNA  Anesthesia Plan Comments:         Anesthesia Quick Evaluation

## 2011-09-01 NOTE — Preoperative (Signed)
Beta Blockers   Reason not to administer Beta Blockers:Not Applicable 

## 2011-09-01 NOTE — Progress Notes (Signed)
ekg shown to [place]  PA.

## 2011-09-01 NOTE — Brief Op Note (Addendum)
09/01/2011  2:57 PM  PATIENT:  Philip Freeman  51 y.o. 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  PROCEDURE:  Procedure(s): MICRODISCECTOMY LUMBAR LAMINECTOMY Left L4-5  SURGEON:  Surgeon(s): Eldred Manges  PHYSICIAN ASSISTANT: sheila vernon PA-C  ASSISTANTS: above  ANESTHESIA:   local and general  EBL:  Total I/O In: 1000 [I.V.:1000] Out: 50 [Blood:50]  BLOOD ADMINISTERED:none  DRAINS: none   LOCAL MEDICATIONS USED:  MARCAINE less than 10 CC  SPECIMEN:  No Specimen  DISPOSITION OF SPECIMEN:  N/A  COUNTS:  YES  TOURNIQUET:  * No tourniquets in log *  DICTATION: .Other Dictation: Dictation Number [id number]  PLAN OF CARE: Admit to inpatient   PATIENT DISPOSITION:  PACU - hemodynamically stable.   Delay start of Pharmacological VTE agent (>24hrs) due to surgical blood loss or risk of bleeding:  {YES/NO/NOT APPLICABLE:20182

## 2011-09-01 NOTE — Anesthesia Procedure Notes (Signed)
Procedure Name: Intubation Date/Time: 09/01/2011 1:36 PM Performed by: Leona Singleton A. Patient Re-evaluated:Patient Re-evaluated prior to inductionOxygen Delivery Method: Circle System Utilized Preoxygenation: Pre-oxygenation with 100% oxygen Intubation Type: IV induction Ventilation: Mask ventilation without difficulty Laryngoscope Size: Miller and 2 Grade View: Grade II Tube type: Oral Tube size: 7.5 mm Number of attempts: 1 Airway Equipment and Method: stylet Placement Confirmation: ETT inserted through vocal cords under direct vision,  positive ETCO2 and breath sounds checked- equal and bilateral Secured at: 23 cm Tube secured with: Tape Dental Injury: Teeth and Oropharynx as per pre-operative assessment  Comments: LTA 4% 4mL used.

## 2011-09-01 NOTE — Transfer of Care (Signed)
Immediate Anesthesia Transfer of Care Note  Patient: Philip Freeman  Procedure(s) Performed:  MICRODISCECTOMY LUMBAR LAMINECTOMY - Left Lumbar 4-5 Laminotomy Microdiscectomy  Patient Location: PACU  Anesthesia Type: General  Level of Consciousness: sedated, patient cooperative and responds to stimulation  Airway & Oxygen Therapy: Patient Spontanous Breathing and Patient connected to face mask oxygen  Post-op Assessment: Report given to PACU RN, Post -op Vital signs reviewed and stable and Patient moving all extremities X 4  Post vital signs: Reviewed and stable  Complications: No apparent anesthesia complications

## 2011-09-01 NOTE — Progress Notes (Signed)
NOTIFIED DR Ophelia Charter OF PATIENT HAVING ELEVATED WBC 16.3 TODAY , SPOUSE STATED PATIENT HAD LOW GRADE TEMP LAST EVENING AND FELT WARM TODAY.  PATIENT IS AFEBRILE TODAY .  DR YATES STATED HE BELIEVED WE WERE OKAY TO PROCEED.

## 2011-09-01 NOTE — H&P (Signed)
H and P updated and reviewed, pt seen and examined. Plan as above.

## 2011-09-02 ENCOUNTER — Encounter (HOSPITAL_COMMUNITY): Payer: Self-pay | Admitting: Orthopaedic Surgery

## 2011-09-02 MED ORDER — METHOCARBAMOL 500 MG PO TABS: 500.0000 mg | ORAL_TABLET | Freq: Four times a day (QID) | ORAL | Status: AC | PRN

## 2011-09-02 MED ORDER — METOPROLOL SUCCINATE ER 25 MG PO TB24: 25.0000 mg | ORAL_TABLET | Freq: Every day | ORAL | Status: DC

## 2011-09-02 MED ORDER — OXYCODONE-ACETAMINOPHEN 5-325 MG PO TABS: 1.0000 | ORAL_TABLET | ORAL | Status: AC | PRN

## 2011-09-02 MED FILL — Hydromorphone HCl Inj 1 MG/ML: INTRAMUSCULAR | Qty: 1 | Status: AC

## 2011-09-02 NOTE — Progress Notes (Signed)
Subjective: Comfortable Walking in hallway and room Had BM this am  No nausea or vomiting Leg pain resolved.  Mild back pain as expected Ready to go home.   Objective: Vital signs in last 24 hours: Temp:  [97.9 F (36.6 C)-98.4 F (36.9 C)] 97.9 F (36.6 C) (01/10 0536) Pulse Rate:  [68-101] 77  (01/10 0536) Resp:  [7-20] 18  (01/10 0536) BP: (97-127)/(60-82) 97/60 mmHg (01/10 0536) SpO2:  [93 %-100 %] 96 % (01/10 0806) Weight:  [68.4 kg (150 lb 12.7 oz)] 68.4 kg (150 lb 12.7 oz) (01/09 1930)  Intake/Output from previous day: 01/09 0701 - 01/10 0700 In: 3120 [P.O.:720; I.V.:2400] Out: 2000 [Urine:1950; Blood:50] Intake/Output this shift:     Basename 09/01/11 1129  HGB 14.2    Basename 09/01/11 1129  WBC 16.3*  RBC 4.02*  HCT 39.1  PLT 140*    Basename 09/01/11 1200  NA 137  K 3.6  CL 101  CO2 25  BUN 8  CREATININE 0.66  GLUCOSE 96  CALCIUM 9.2    Basename 09/01/11 1129  LABPT --  INR 0.98    Neurovascular intact Sensation intact distally Intact pulses distally Dorsiflexion/Plantar flexion intact Incision: no drainage EHL strength improved from pre op exam  Now 5/5 Assessment/Plan: POD#1 left L4-5 microdiscectomy Discharge to home OV 1 week   Philip Freeman M 09/02/2011, 10:17 AM
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
## 2011-09-02 NOTE — Plan of Care (Signed)
Problem: Consults Goal: Diagnosis - Spinal Surgery Outcome: Completed/Met Date Met:  09/02/11 Microdiscectomy

## 2012-07-23 ENCOUNTER — Observation Stay (HOSPITAL_COMMUNITY)
Admission: EM | Admit: 2012-07-23 | Discharge: 2012-07-25 | DRG: 552 | Disposition: A | Discharge status: Home / Self Care | Payer: Medicare PPO | Attending: General Surgery | Admitting: General Surgery

## 2012-07-23 ENCOUNTER — Encounter (HOSPITAL_COMMUNITY): Payer: Self-pay | Admitting: Emergency Medicine

## 2012-07-23 ENCOUNTER — Emergency Department (HOSPITAL_COMMUNITY): Payer: Medicare PPO

## 2012-07-23 DIAGNOSIS — Z79899 Other long term (current) drug therapy: Secondary | ICD-10-CM

## 2012-07-23 DIAGNOSIS — F17200 Nicotine dependence, unspecified, uncomplicated: Secondary | ICD-10-CM | POA: Diagnosis present

## 2012-07-23 DIAGNOSIS — B1920 Unspecified viral hepatitis C without hepatic coma: Secondary | ICD-10-CM | POA: Diagnosis present

## 2012-07-23 DIAGNOSIS — S2231XA Fracture of one rib, right side, initial encounter for closed fracture: Secondary | ICD-10-CM | POA: Diagnosis present

## 2012-07-23 DIAGNOSIS — S22009A Unspecified fracture of unspecified thoracic vertebra, initial encounter for closed fracture: Principal | ICD-10-CM

## 2012-07-23 DIAGNOSIS — I10 Essential (primary) hypertension: Secondary | ICD-10-CM | POA: Diagnosis present

## 2012-07-23 DIAGNOSIS — S2239XA Fracture of one rib, unspecified side, initial encounter for closed fracture: Secondary | ICD-10-CM

## 2012-07-23 DIAGNOSIS — G8929 Other chronic pain: Secondary | ICD-10-CM | POA: Diagnosis present

## 2012-07-23 DIAGNOSIS — J438 Other emphysema: Secondary | ICD-10-CM | POA: Diagnosis present

## 2012-07-23 DIAGNOSIS — R911 Solitary pulmonary nodule: Secondary | ICD-10-CM | POA: Diagnosis present

## 2012-07-23 DIAGNOSIS — F411 Generalized anxiety disorder: Secondary | ICD-10-CM | POA: Diagnosis present

## 2012-07-23 DIAGNOSIS — S22089A Unspecified fracture of T11-T12 vertebra, initial encounter for closed fracture: Secondary | ICD-10-CM | POA: Diagnosis present

## 2012-07-23 LAB — COMPREHENSIVE METABOLIC PANEL
ALT: 81 U/L — ABNORMAL HIGH (ref 0–53)
BUN: 8 mg/dL (ref 6–23)
Calcium: 9 mg/dL (ref 8.4–10.5)
GFR calc Af Amer: >90 mL/min (ref >90)
Glucose, Bld: 171 mg/dL — ABNORMAL HIGH (ref 70–99)
Sodium: 139 mEq/L (ref 135–145)
Total Protein: 6.8 g/dL (ref 6.0–8.3)

## 2012-07-23 LAB — CBC
Hemoglobin: 13.9 g/dL (ref 13.0–17.0)
MCH: 32.7 pg (ref 26.0–34.0)
MCHC: 34.5 g/dL (ref 30.0–36.0)

## 2012-07-23 LAB — MRSA PCR SCREENING: MRSA by PCR: NEGATIVE

## 2012-07-23 MED ORDER — ONDANSETRON HCL 4 MG/2ML IJ SOLN: 4.0000 mg | Freq: Four times a day (QID) | INTRAMUSCULAR | Status: DC | PRN

## 2012-07-23 MED ORDER — PANTOPRAZOLE SODIUM 40 MG IV SOLR
40.0000 mg | Freq: Every day | INTRAVENOUS | Status: DC
  Administered 2012-07-23: 40 mg via INTRAVENOUS
  Filled 2012-07-23 (×2): qty 40

## 2012-07-23 MED ORDER — SODIUM CHLORIDE 0.9 % IJ SOLN
9.0000 mL | INTRAMUSCULAR | Status: DC | PRN
  Administered 2012-07-23: 9 mL via INTRAVENOUS

## 2012-07-23 MED ORDER — DEXTROSE-NACL 5-0.9 % IV SOLN: INTRAVENOUS | Status: DC

## 2012-07-23 MED ORDER — METOPROLOL SUCCINATE ER 25 MG PO TB24
25.0000 mg | ORAL_TABLET | Freq: Every day | ORAL | Status: DC
  Administered 2012-07-24 – 2012-07-25 (×2): 25 mg via ORAL
  Filled 2012-07-23 (×2): qty 1

## 2012-07-23 MED ORDER — AMLODIPINE BESYLATE 5 MG PO TABS
5.0000 mg | ORAL_TABLET | Freq: Every day | ORAL | Status: DC
  Administered 2012-07-24 – 2012-07-25 (×2): 5 mg via ORAL
  Filled 2012-07-23 (×2): qty 1

## 2012-07-23 MED ORDER — ONDANSETRON HCL 4 MG PO TABS
4.0000 mg | ORAL_TABLET | Freq: Four times a day (QID) | ORAL | Status: DC | PRN
  Administered 2012-07-25: 4 mg via ORAL
  Filled 2012-07-23: qty 0.5

## 2012-07-23 MED ORDER — DIPHENHYDRAMINE HCL 12.5 MG/5ML PO ELIX
12.5000 mg | ORAL_SOLUTION | Freq: Four times a day (QID) | ORAL | Status: DC | PRN
  Filled 2012-07-23: qty 5

## 2012-07-23 MED ORDER — ONDANSETRON HCL 4 MG PO TABS: 4.0000 mg | ORAL_TABLET | Freq: Four times a day (QID) | ORAL | Status: DC | PRN

## 2012-07-23 MED ORDER — HYDROMORPHONE HCL PF 1 MG/ML IJ SOLN
1.0000 mg | Freq: Once | INTRAMUSCULAR | Status: AC
  Administered 2012-07-23: 1 mg via INTRAVENOUS
  Filled 2012-07-23: qty 1

## 2012-07-23 MED ORDER — DIPHENHYDRAMINE HCL 50 MG/ML IJ SOLN: 12.5000 mg | Freq: Four times a day (QID) | INTRAMUSCULAR | Status: DC | PRN

## 2012-07-23 MED ORDER — ONDANSETRON HCL 4 MG/2ML IJ SOLN
INTRAMUSCULAR | Status: AC
  Filled 2012-07-23: qty 2

## 2012-07-23 MED ORDER — ONDANSETRON HCL 4 MG/2ML IJ SOLN
4.0000 mg | Freq: Four times a day (QID) | INTRAMUSCULAR | Status: DC | PRN
  Administered 2012-07-23: 4 mg via INTRAVENOUS

## 2012-07-23 MED ORDER — ONDANSETRON HCL 4 MG/2ML IJ SOLN
4.0000 mg | Freq: Four times a day (QID) | INTRAMUSCULAR | Status: DC | PRN
  Administered 2012-07-24 – 2012-07-25 (×5): 4 mg via INTRAVENOUS
  Filled 2012-07-23 (×5): qty 2

## 2012-07-23 MED ORDER — HYDROMORPHONE 0.3 MG/ML IV SOLN
INTRAVENOUS | Status: DC
  Administered 2012-07-23: 2.7 mg via INTRAVENOUS
  Administered 2012-07-23: 20:00:00 via INTRAVENOUS
  Administered 2012-07-24: 0.3 mg via INTRAVENOUS
  Administered 2012-07-24: 1.2 mg via INTRAVENOUS

## 2012-07-23 MED ORDER — PANTOPRAZOLE SODIUM 40 MG PO TBEC: 40.0000 mg | DELAYED_RELEASE_TABLET | Freq: Every day | ORAL | Status: DC

## 2012-07-23 MED ORDER — SODIUM CHLORIDE 0.9 % IJ SOLN
INTRAMUSCULAR | Status: AC
  Filled 2012-07-23: qty 10

## 2012-07-23 MED ORDER — DEXTROSE-NACL 5-0.9 % IV SOLN
INTRAVENOUS | Status: DC
  Administered 2012-07-23 – 2012-07-24 (×2): via INTRAVENOUS
  Administered 2012-07-25: 1000 mL via INTRAVENOUS

## 2012-07-23 MED ORDER — NALOXONE HCL 0.4 MG/ML IJ SOLN: 0.4000 mg | INTRAMUSCULAR | Status: DC | PRN

## 2012-07-23 NOTE — Progress Notes (Signed)
CT c spine negative.  D/C c collar.  Exam non focal with FROM with significant pain
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
## 2012-07-23 NOTE — ED Notes (Signed)
Notified Dr. Luisa Hart that pt has removed his C-collar.  Pt is not cleared at this time as he has not reviewed C-spine CT.  Will notify pt again that MD has order for him to wear collar

## 2012-07-23 NOTE — ED Notes (Signed)
Pt alert, NAD, calm, interactive. 

## 2012-07-23 NOTE — ED Notes (Signed)
Patient transported to CT 
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
## 2012-07-23 NOTE — ED Notes (Signed)
Attempt to call report to [phone], RN will call me back once she gets here.

## 2012-07-23 NOTE — ED Notes (Signed)
Dr. Davina Poke provide a verbal for Q 1 hr. Dilaudid until PCA is at bedside.

## 2012-07-23 NOTE — ED Notes (Signed)
MD at bedside.Jenkins Neuorsurgeon
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
## 2012-07-23 NOTE — ED Notes (Signed)
Pt has removed his c-collar, advised that it is not permitted by MD but pt states that he can not tolerate due to nausea.

## 2012-07-23 NOTE — ED Notes (Signed)
Vital signs stable. 
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
## 2012-07-24 ENCOUNTER — Encounter (HOSPITAL_COMMUNITY): Payer: Self-pay

## 2012-07-24 ENCOUNTER — Observation Stay (HOSPITAL_COMMUNITY): Payer: Medicare PPO

## 2012-07-24 DIAGNOSIS — R768 Other specified abnormal immunological findings in serum: Secondary | ICD-10-CM | POA: Insufficient documentation

## 2012-07-24 DIAGNOSIS — I10 Essential (primary) hypertension: Secondary | ICD-10-CM | POA: Insufficient documentation

## 2012-07-24 DIAGNOSIS — S22089A Unspecified fracture of T11-T12 vertebra, initial encounter for closed fracture: Secondary | ICD-10-CM | POA: Diagnosis present

## 2012-07-24 DIAGNOSIS — S2231XA Fracture of one rib, right side, initial encounter for closed fracture: Secondary | ICD-10-CM | POA: Diagnosis present

## 2012-07-24 DIAGNOSIS — G8929 Other chronic pain: Secondary | ICD-10-CM | POA: Diagnosis present

## 2012-07-24 DIAGNOSIS — F419 Anxiety disorder, unspecified: Secondary | ICD-10-CM | POA: Insufficient documentation

## 2012-07-24 LAB — COMPREHENSIVE METABOLIC PANEL
ALT: 72 U/L — ABNORMAL HIGH (ref 0–53)
AST: 47 U/L — ABNORMAL HIGH (ref 0–37)
CO2: 25 mEq/L (ref 19–32)
Calcium: 8.8 mg/dL (ref 8.4–10.5)
Chloride: 106 mEq/L (ref 96–112)
GFR calc non Af Amer: >90 mL/min (ref >90)
Potassium: 4.1 mEq/L (ref 3.5–5.1)
Sodium: 140 mEq/L (ref 135–145)
Total Bilirubin: 0.7 mg/dL (ref 0.3–1.2)

## 2012-07-24 LAB — CBC
Platelets: 137 10*3/uL — ABNORMAL LOW (ref 150–400)
RBC: 4.23 MIL/uL (ref 4.22–5.81)
WBC: 11.2 10*3/uL — ABNORMAL HIGH (ref 4.0–10.5)

## 2012-07-24 MED ORDER — HYDROMORPHONE HCL PF 1 MG/ML IJ SOLN
0.5000 mg | INTRAMUSCULAR | Status: DC | PRN
  Administered 2012-07-24 – 2012-07-25 (×4): 0.5 mg via INTRAVENOUS
  Filled 2012-07-24 (×4): qty 1

## 2012-07-24 MED ORDER — HYDROCODONE-ACETAMINOPHEN 10-325 MG PO TABS
0.5000 | ORAL_TABLET | ORAL | Status: DC | PRN
  Administered 2012-07-24: 1 via ORAL
  Administered 2012-07-24: 2 via ORAL
  Administered 2012-07-24: 1 via ORAL
  Administered 2012-07-25 (×5): 2 via ORAL
  Filled 2012-07-24: qty 2
  Filled 2012-07-24: qty 1
  Filled 2012-07-24 (×2): qty 2
  Filled 2012-07-24: qty 1
  Filled 2012-07-24 (×3): qty 2

## 2012-07-24 MED ORDER — METHOCARBAMOL 750 MG PO TABS
1500.0000 mg | ORAL_TABLET | Freq: Four times a day (QID) | ORAL | Status: DC
  Administered 2012-07-24 – 2012-07-25 (×6): 1500 mg via ORAL
  Filled 2012-07-24 (×9): qty 2

## 2012-07-24 MED ORDER — ENOXAPARIN SODIUM 40 MG/0.4ML ~~LOC~~ SOLN
40.0000 mg | SUBCUTANEOUS | Status: DC
  Administered 2012-07-24 – 2012-07-25 (×2): 40 mg via SUBCUTANEOUS
  Filled 2012-07-24 (×3): qty 0.4

## 2012-07-24 MED FILL — Fentanyl Citrate Inj 0.05 MG/ML: INTRAMUSCULAR | Qty: 2 | Status: AC

## 2012-07-24 MED FILL — Morphine Sulfate Inj PF 0.5 MG/ML: INTRAMUSCULAR | Qty: 2 | Status: AC
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
## 2012-07-24 NOTE — Progress Notes (Signed)
Orthopedic Tech Progress Note Patient Details:  Philip Freeman Apr 21, 1961 [id number]  Patient ID: Philip Freeman, male   DOB: [birth year], 50 y.o.   MRN: [id number]   [name] 07/24/2012, 8:49 AM CALLED BIO[facility]TECH FOR LUMBAR CORSET.

## 2012-07-25 MED ORDER — METHOCARBAMOL 500 MG PO TABS: 500.0000 mg | ORAL_TABLET | Freq: Four times a day (QID) | ORAL | Status: DC | PRN

## 2012-07-25 MED ORDER — TRAMADOL HCL 50 MG PO TABS: 100.0000 mg | ORAL_TABLET | Freq: Four times a day (QID) | ORAL | Status: AC

## 2012-07-25 MED ORDER — HYDROCODONE-ACETAMINOPHEN 10-325 MG PO TABS: 0.5000 | ORAL_TABLET | ORAL | Status: AC | PRN

## 2012-07-25 MED ORDER — ONDANSETRON HCL 4 MG PO TABS: 4.0000 mg | ORAL_TABLET | ORAL | Status: AC | PRN

## 2012-07-25 MED ORDER — TRAMADOL HCL 50 MG PO TABS
100.0000 mg | ORAL_TABLET | Freq: Four times a day (QID) | ORAL | Status: DC
  Administered 2012-07-25 (×3): 100 mg via ORAL
  Filled 2012-07-25 (×3): qty 2
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
## 2012-07-25 NOTE — Progress Notes (Signed)
Getting up with PT/OT now - will see how he does Patient examined and I agree with the assessment and plan  Violeta Gelinas, MD, MPH, FACS Pager: [phone]  07/25/2012 10:35 AM

## 2012-07-25 NOTE — Progress Notes (Signed)
Pt to d/c home with HHPT at home in [place], [place].  Pt  Asked me to speak with his wife about choice of agency. She requested [facility] in [place].  Called there and they do accept [facility].  Faxed all requested info to them and they will get the approval.  [facility] is not able to fulfill the rolling walker.  Called [facility] who is contracted with [employer] and they can't deliver to the patient's house. Pt said he can get into the house without it so he will be sent with Rx for the walker that he can fulfill at a local medical supply store.

## 2012-07-25 NOTE — Progress Notes (Signed)
Occupational Therapy Treatment Patient Details Name: NAMIR NETO MRN: [id number] DOB: [birth year] Today's Date: 07/25/2012 Time: 1010-1104 OT Time Calculation (min): 54 min  OT Assessment / Plan / Recommendation Comments on Treatment Session Pt making excellent progress. Pt ambulating in hall @ 100'. Completed ADL and AE education. Pt @ S level with ADL and mobility for ADL. Will need RW for home D/C. If pt is able to negotiate stairs, feel pt will be able to D/C home this pm. Pt in agreement.     Follow Up Recommendations  No OT follow up    Barriers to Discharge   none    Equipment Recommendations  Rolling walker with 5" wheels    Recommendations for Other Services  none  Frequency Min 2X/week   Plan Discharge plan remains appropriate    Precautions / Restrictions Precautions Precautions: Back;Fall Precaution Comments: pt verbalized precautions Required Braces or Orthoses: Spinal Brace Spinal Brace: Applied in sitting position Restrictions Weight Bearing Restrictions: No   Pertinent Vitals/Pain 5/10 R ribs    ADL  Grooming: Supervision/safety;Set up Where Assessed - Grooming: Unsupported standing Upper Body Bathing: Set up Where Assessed - Upper Body Bathing: Unsupported sit to stand Lower Body Bathing: Supervision/safety;Set up Where Assessed - Lower Body Bathing: Unsupported sit to stand Upper Body Dressing: Set up Where Assessed - Upper Body Dressing: Unsupported sitting Lower Body Dressing: Set up;Supervision/safety Where Assessed - Lower Body Dressing: Unsupported sit to stand Toilet Transfer: Supervision/safety Toilet Transfer Method: Sit to Barista: Comfort height toilet Toileting - Clothing Manipulation and Hygiene: Supervision/safety Where Assessed - Engineer, mining and Hygiene: Standing Equipment Used: Back brace;Gait belt;Long-handled shoe horn;Long-handled sponge;Reacher;Rolling walker;Sock  aid Transfers/Ambulation Related to ADLs: Min guard ADL Comments: Educated pt on AE and availability. donned brace with S    OT Diagnosis:    OT Problem List:   OT Treatment Interventions:     OT Goals Acute Rehab OT Goals OT Goal Formulation: With patient Time For Goal Achievement: 07/31/12 Potential to Achieve Goals: Good ADL Goals Pt Will Perform Grooming: with modified independence;Unsupported;Standing at sink ADL Goal: Grooming - Progress: Progressing toward goals Pt Will Perform Upper Body Bathing: Independently;Unsupported;Sitting at sink;Standing at sink ADL Goal: Upper Body Bathing - Progress: Progressing toward goals Pt Will Perform Lower Body Bathing: Unsupported;Standing at sink;Sitting at sink;with adaptive equipment;with modified independence ADL Goal: Lower Body Bathing - Progress: Progressing toward goals Pt Will Perform Upper Body Dressing: with modified independence;Sit to stand from chair;Sit to stand from bed;Unsupported ADL Goal: Upper Body Dressing - Progress: Progressing toward goals Pt Will Perform Lower Body Dressing: with modified independence;Sit to stand from bed;Sit to stand from chair;Unsupported;with adaptive equipment ADL Goal: Lower Body Dressing - Progress: Progressing toward goals Pt Will Transfer to Toilet: with modified independence;Ambulation;with DME;3-in-1;Maintaining back safety precautions ADL Goal: Toilet Transfer - Progress: Progressing toward goals Pt Will Perform Toileting - Clothing Manipulation: Independently;Standing ADL Goal: Toileting - Clothing Manipulation - Progress: Progressing toward goals Pt Will Perform Toileting - Hygiene: Independently;Sit to stand from 3-in-1/toilet ADL Goal: Toileting - Hygiene - Progress: Progressing toward goals Pt Will Perform Tub/Shower Transfer: with modified independence;Ambulation;with DME;Shower transfer Miscellaneous OT Goals Miscellaneous OT Goal #1: Pt will be able to state 3/3 back  precautions OT Goal: Miscellaneous Goal #1 - Progress: Met Miscellaneous OT Goal #2: Pt will be able to come up to sit and lay down at a Mod I level for BADLs. OT Goal: Miscellaneous Goal #2 - Progress: Progressing toward goals Miscellaneous  OT Goal #3: Pt will be able to don/doff brace independently OT Goal: Miscellaneous Goal #3 - Progress: Met  Visit Information  Last OT Received On: 07/25/12    Subjective Data      Prior Functioning       Cognition  Overall Cognitive Status: Appears within functional limits for tasks assessed/performed Arousal/Alertness: Awake/alert Orientation Level: Appears intact for tasks assessed Behavior During Session: [facility] for tasks performed    Mobility  Shoulder Instructions Bed Mobility Bed Mobility: Rolling Left;Left Sidelying to Sit;Sitting - Scoot to Edge of Bed Rolling Left: 6: Modified independent (Device/Increase time) Left Sidelying to Sit: 5: Supervision;HOB flat Sitting - Scoot to Edge of Bed: 6: Modified independent (Device/Increase time) Transfers Transfers: Sit to Stand;Stand to Sit Sit to Stand: 4: Min assist;With upper extremity assist;From bed Stand to Sit: 4: Min guard;With upper extremity assist;To chair/3-in-1 Details for Transfer Assistance: vc for technique       Exercises      Balance  mod I   End of Session OT - End of Session Equipment Utilized During Treatment: Gait belt;Back brace Activity Tolerance: Patient tolerated treatment well Patient left: in chair;with call bell/phone within reach Nurse Communication: Mobility status;Other (comment) (pt nauseated earlier in session)  GO     Caston Coopersmith,HILLARY 07/25/2012, 11:10 AM Luisa Dago, OTR/L  [phone] 07/25/2012

## 2012-07-25 NOTE — Discharge Summary (Signed)
Philip Warzecha, MD, MPH, FACS Pager: [phone]  

## 2012-07-25 NOTE — Progress Notes (Signed)
Physical Therapy Treatment Patient Details Name: Philip Freeman MRN: [id number] DOB: 1960/11/06 Today's Date: 07/25/2012 Time: 4098-1191 PT Time Calculation (min): 25 min  PT Assessment / Plan / Recommendation Comments on Treatment Session  Pt. presents to be moving well when OOB and with RW. Pt. able to execute 3 steps with no rails using RW and ascending backwards with someone holding RW in front for extra safety. Pt. demonstrated proper technique and educated to have someone with him when doing the stairs at home. Pt. reminded of back precautions and continues to need some minor cuing to follow those with bed mobility and with direction changes during ambulation.    Follow Up Recommendations  Home health PT;Supervision/Assistance - 24 hour     Does the patient have the potential to tolerate intense rehabilitation     Barriers to Discharge        Equipment Recommendations  Rolling walker with 5" wheels    Recommendations for Other Services    Frequency Min 5X/week   Plan      Precautions / Restrictions Precautions Precautions: Back;Fall Precaution Comments: Pt. educated and verbalized back precautions with max cuing Required Braces or Orthoses: Spinal Brace Spinal Brace: Applied in sitting position Restrictions Weight Bearing Restrictions: No   Pertinent Vitals/Pain Patient rates pain 8/10 in his back and ribs. RN notified and presented with medications at start of session.    Mobility  Bed Mobility Bed Mobility: Rolling Left;Left Sidelying to Sit;Sitting - Scoot to [place] of Bed;Sit to Sidelying Left;Scooting to [facility] Rolling Left: 6: Modified independent (Device/Increase time) Left Sidelying to Sit: 6: Modified independent (Device/Increase time);HOB flat Sitting - Scoot to Edge of Bed: 6: Modified independent (Device/Increase time) Sit to Sidelying Left: 5: Supervision;HOB flat Scooting to HOB: 5: Supervision Details for Bed Mobility Assistance: Pt. given no physical (A)  with getting in/OOB only minor VC for proper hand placement and for technique to follow back precautions. Transfers Transfers: Sit to Stand;Stand to Sit Sit to Stand: 5: Supervision;With upper extremity assist;From bed;With armrests;From chair/3-in-1 Stand to Sit: 5: Supervision;With upper extremity assist;With armrests;To chair/3-in-1 Details for Transfer Assistance: Supervision for giving VC for technique and proper hand placement when stepping to/sitting from RW. Ambulation/Gait Ambulation/Gait Assistance: 5: Supervision Ambulation Distance (Feet): 100 Feet Assistive device: Rolling walker Ambulation/Gait Assistance Details: Pt. used RW to ambulate from stair well back to his room. Only minor cues for maintaing back precautions with direction changes and proper hand placement on handles of RW. Gait Pattern: Step-through pattern;Decreased stride length;Antalgic Stairs: Yes Stairs Assistance: 4: Curator Details (indicate cue type and reason):  Cues for sequencing & technique with min (A) only needed to stabilize & position RW. Pt. reports he will have adequate (A) for steps.   Pt. Encouraged to squat at the knees to maintain back precautions when bring the walker to the next level necessary to complete steps. Stair Management Technique: No rails;Step to pattern;Backwards;With walker Number of Stairs: 3  Wheelchair Mobility Wheelchair Mobility: No      PT Goals Acute Rehab PT Goals PT Goal Formulation: With patient/family Time For Goal Achievement: 07/31/12 Potential to Achieve Goals: Good Pt will Roll Supine to Left Side: Independently PT Goal: Rolling Supine to Left Side - Progress: Progressing toward goal Pt will go Supine/Side to Sit: Independently;with HOB 0 degrees PT Goal: Supine/Side to Sit - Progress: Progressing toward goal Pt will go Sit to Supine/Side: Independently;with HOB 0 degrees PT Goal: Sit to Supine/Side - Progress: Progressing toward  goal Pt  will go Sit to Stand: with supervision;with upper extremity assist PT Goal: Sit to Stand - Progress: Progressing toward goal Pt will Ambulate: >150 feet;with supervision;with rolling walker PT Goal: Ambulate - Progress: Progressing toward goal Pt will Go Up / Down Stairs: 3-5 stairs;with supervision;with least restrictive assistive device PT Goal: Up/Down Stairs - Progress: Progressing toward goal  Visit Information  Last PT Received On: 07/25/12 Assistance Needed: +1    Subjective Data  Subjective: "I am in some pain and I want to go home" Patient Stated Goal: To stop hurting.  To get home   Cognition  Overall Cognitive Status: Appears within functional limits for tasks assessed/performed Arousal/Alertness: Awake/alert Orientation Level: Appears intact for tasks assessed Behavior During Session: [facility] for tasks performed    Balance  Balance Balance Assessed: No  End of Session PT - End of Session Equipment Utilized During Treatment: Gait belt;Back brace Activity Tolerance: Patient tolerated treatment well Patient left: in bed;with call bell/phone within reach Nurse Communication: Mobility status;Patient requests pain meds;Other (comment) (Orering meds from pharmacy if he d/c's home tonight)    Mertie Clause, Leda Gauze 07/25/2012, 1:53 PM    Verdell Face, [place] [phone] 07/25/2012

## 2012-09-23 IMAGING — CR DG LUMBAR SPINE 1V
1 series · 1 of 1 positions shown · non-contrast
Comparison: MRI lumbar spine 08/27/2011.

CLINICAL DATA: L4-5 microdiskectomy.

LUMBAR SPINE - 1 VIEW

[view not recorded]
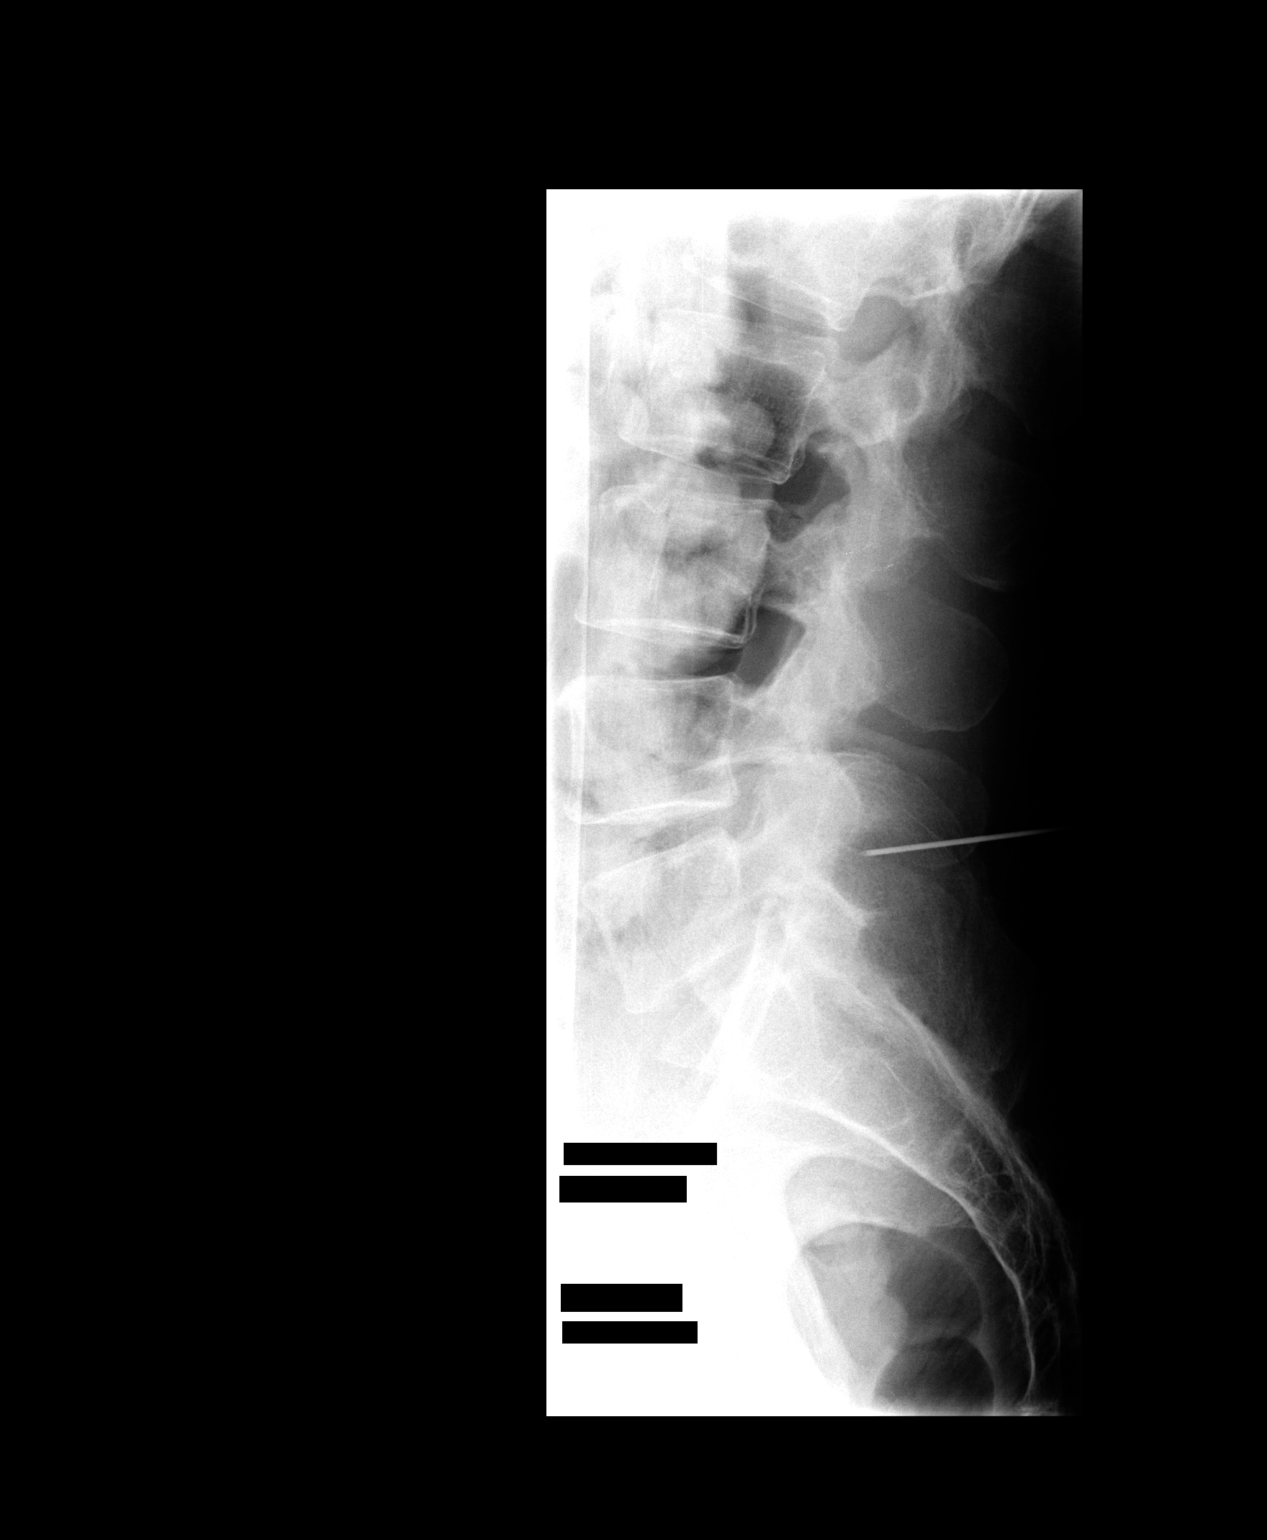

[1 of 1 positions shown; findings below may reference images not displayed]

FINDINGS: We are provided with a single intraoperative view of the
lumbar spine in the lateral projection.  The image shows a metallic
probe at the level of the L5 pedicles.
IMPRESSION: Localization as above.
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
## 2017-03-10 ENCOUNTER — Encounter (INDEPENDENT_AMBULATORY_CARE_PROVIDER_SITE_OTHER): Payer: Self-pay | Admitting: Orthopaedic Surgery

## 2017-03-10 ENCOUNTER — Ambulatory Visit (INDEPENDENT_AMBULATORY_CARE_PROVIDER_SITE_OTHER): Payer: Medicare PPO

## 2017-03-10 ENCOUNTER — Ambulatory Visit (INDEPENDENT_AMBULATORY_CARE_PROVIDER_SITE_OTHER): Payer: Medicare PPO | Admitting: Orthopaedic Surgery

## 2017-03-10 VITALS — BP 157/97 | HR 74

## 2017-03-10 DIAGNOSIS — M25522 Pain in left elbow: Secondary | ICD-10-CM

## 2017-03-10 DIAGNOSIS — M7712 Lateral epicondylitis, left elbow: Secondary | ICD-10-CM
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
## 2017-03-28 MED ORDER — LIDOCAINE HCL 1 % IJ SOLN
1.0000 mL | INTRAMUSCULAR | Status: AC | PRN
  Administered 2017-03-10: 1 mL

## 2017-03-28 MED ORDER — BUPIVACAINE HCL 0.5 % IJ SOLN
1.0000 mL | INTRAMUSCULAR | Status: AC | PRN
  Administered 2017-03-10: 1 mL via INTRA_ARTICULAR

## 2017-03-28 MED ORDER — METHYLPREDNISOLONE ACETATE 40 MG/ML IJ SUSP
40.0000 mg | INTRAMUSCULAR | Status: AC | PRN
  Administered 2017-03-10: 40 mg via INTRA_ARTICULAR

## 2023-11-29 ENCOUNTER — Other Ambulatory Visit (INDEPENDENT_AMBULATORY_CARE_PROVIDER_SITE_OTHER): Payer: Self-pay

## 2023-11-29 ENCOUNTER — Ambulatory Visit: Admitting: Orthopedic Surgery

## 2023-11-29 VITALS — BP 183/103 | HR 84 | Ht 68.0 in | Wt 125.8 lb

## 2023-11-29 DIAGNOSIS — M545 Low back pain, unspecified: Secondary | ICD-10-CM | POA: Diagnosis not present

## 2023-11-29 DIAGNOSIS — Z720 Tobacco use: Secondary | ICD-10-CM | POA: Diagnosis not present

## 2023-11-29 MED ORDER — METHOCARBAMOL 500 MG PO TABS: 500.0000 mg | ORAL_TABLET | Freq: Four times a day (QID) | ORAL | 0 refills | Status: AC | PRN

## 2023-11-29 NOTE — Progress Notes (Signed)
 Orthopedic Spine Surgery Office Note  Assessment: Patient is a 63 y.o. male with low back pain that periodically radiates into the right lateral thigh to the level of the knee.  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 Discussed that the conservative treatments include:  -activity modification  -physical therapy  -over the counter pain medications  -medrol dosepak  -lumbar steroid injections -Patient has tried prednisone - Told him he can take Tylenol up to 1000 mg 3 times a day.  Prescribe Robaxin for additional pain relief.  Recommended physical therapy -Will get an MRI if patient is doing no better at her next visit -Patient should return to office in 6 weeks, x-rays at next visit: None   Discussed smoking cessation with the patient. I told him that nicotine affects multiple organ systems and does more damage than just lung cancer. I explained that, from a surgical perspective, it increase the risk of complication. For that reason, I make all patients quit prior to any elective spine surgery. Patient expressed understanding and will work on quitting. I spent going over this.   ___________________________________________________________________________   History:  Patient is a 63 y.o. male who presents today for lumbar spine.  Patient has a history of L4/5 microdiscectomy in 2013.  He did well after surgery.  Within the last 2 weeks, he has noticed onset of severe low back pain.  He has had it periodically radiate into the right lower extremity.  He feels it along the lateral aspect of the thigh.  It does not radiate past the knee.  He said no pain radiating into the left lower extremity.  He has been taking prednisone and has found that helpful.  There is no trauma or injury that pursue the onset of pain.   Weakness: Denies Symptoms of imbalance:  Denies Paresthesias and numbness: Denies Bowel or bladder incontinence: Denies Saddle anesthesia: Denies  Treatments tried: Prednisone  Review of systems: Denies fevers and chills, night sweats, unexplained weight loss, history of cancer.  Has had pain that wakes him at night  Past medical history: COPD HTN  Allergies: NKDA  Past surgical history:  Right thumb fusion Hernia repair L4/5 microdiscectomy  Social history: Reports use of nicotine product (smoking, vaping, patches, smokeless) Alcohol use: Yes, approximately 20 drinks per week Denies recreational drug use   Physical Exam:  BMI of 19.1  General: no acute distress, appears stated age Neurologic: alert, answering questions appropriately, following commands Respiratory: unlabored breathing on room air, symmetric chest rise Psychiatric: appropriate affect, normal cadence to speech   MSK (spine):  -Strength exam      Left  Right EHL    5/5  5/5 TA    5/5  5/5 GSC    5/5  5/5 Knee extension  5/5  5/5 Hip flexion   5/5  5/5  -Sensory exam    Sensation intact to light touch in L3-S1 nerve distributions of bilateral lower extremities  -Achilles DTR: 2/4 on the left, 2/4 on the right -Patellar tendon DTR: 2/4 on the left, 2/4 on the right  -Straight leg raise: negative bilaterally -Femoral nerve stretch test: negative bilaterally -Clonus: no beats bilaterally  -Left hip exam: no pain through range of motion -Right hip exam: no pain through range of motion  Imaging: XRs of the lumbar spine from 11/29/2023 were independently reviewed and interpreted, showing disc height loss  at L4/5 with small anterior osteophyte formation seen.  No other significant degenerative changes.  No evidence of instability on flexion/extension views.  No fracture or dislocation seen.   Patient name: Philip Freeman Patient MRN: [id number] Date of visit: 11/29/23

## 2023-12-15 ENCOUNTER — Telehealth: Payer: Self-pay | Admitting: Orthopedic Surgery

## 2023-12-15 NOTE — Telephone Encounter (Signed)
 Received call from [facility] Medical/referring office. Requesting notes from 11/29/23 appt. I faxed [phone]

## 2024-01-10 ENCOUNTER — Ambulatory Visit: Admitting: Orthopedic Surgery

## 2024-01-11 ENCOUNTER — Ambulatory Visit: Admitting: Orthopedic Surgery

## 2024-06-25 ENCOUNTER — Encounter: Payer: Self-pay | Admitting: Radiology
